# Patient Record
Sex: Male | Born: 1985 | Race: White | Hispanic: No | Marital: Married | Smoking: Never smoker
Health system: Southern US, Community
[De-identification: ages and names within clinical notes are randomized; demographics above are authoritative.]

## PROBLEM LIST (undated history)

## (undated) DIAGNOSIS — K219 Gastro-esophageal reflux disease without esophagitis: Secondary | ICD-10-CM

## (undated) DIAGNOSIS — E079 Disorder of thyroid, unspecified: Secondary | ICD-10-CM

## (undated) DIAGNOSIS — K200 Eosinophilic esophagitis: Secondary | ICD-10-CM

## (undated) HISTORY — PX: FOOT SURGERY: SHX648

## (undated) HISTORY — PX: TONSILLECTOMY: SUR1361

---

## 2009-02-02 ENCOUNTER — Encounter: Admission: RE | Admit: 2009-02-02 | Discharge: 2009-02-02 | Payer: Self-pay | Admitting: Internal Medicine

## 2010-01-21 ENCOUNTER — Observation Stay (HOSPITAL_COMMUNITY): Admission: EM | Admit: 2010-01-21 | Discharge: 2010-01-22 | Payer: Self-pay | Admitting: Emergency Medicine

## 2010-09-09 LAB — CBC
Hemoglobin: 14.7 g/dL (ref 13.0–17.0)
MCH: 32.9 pg (ref 26.0–34.0)
MCHC: 34.2 g/dL (ref 30.0–36.0)
MCV: 96.3 fL (ref 78.0–100.0)
Platelets: 325 10*3/uL (ref 150–400)
RBC: 4.46 MIL/uL (ref 4.22–5.81)
WBC: 14.4 10*3/uL — ABNORMAL HIGH (ref 4.0–10.5)

## 2010-09-09 LAB — DIFFERENTIAL
Basophils Absolute: 0 10*3/uL (ref 0.0–0.1)
Basophils Relative: 0 % (ref 0–1)
Eosinophils Relative: 1 % (ref 0–5)
Lymphocytes Relative: 7 % — ABNORMAL LOW (ref 12–46)
Monocytes Relative: 3 % (ref 3–12)
Neutro Abs: 12.9 10*3/uL — ABNORMAL HIGH (ref 1.7–7.7)

## 2010-09-09 LAB — BASIC METABOLIC PANEL
BUN: 7 mg/dL (ref 6–23)
Calcium: 9.1 mg/dL (ref 8.4–10.5)
Chloride: 104 mEq/L (ref 96–112)
GFR calc non Af Amer: >60 mL/min (ref >60)
Glucose, Bld: 114 mg/dL — ABNORMAL HIGH (ref 70–99)
Sodium: 137 mEq/L (ref 135–145)

## 2010-12-09 ENCOUNTER — Ambulatory Visit: Payer: Self-pay | Admitting: Family Medicine

## 2012-09-25 ENCOUNTER — Emergency Department: Payer: Self-pay | Admitting: Emergency Medicine

## 2012-09-25 LAB — CBC
HCT: 43.6 % (ref 40.0–52.0)
HGB: 14.9 g/dL (ref 13.0–18.0)
Platelet: 325 10*3/uL (ref 150–440)
RBC: 4.64 10*6/uL (ref 4.40–5.90)
RDW: 12.2 % (ref 11.5–14.5)

## 2012-09-25 LAB — COMPREHENSIVE METABOLIC PANEL
Alkaline Phosphatase: 62 U/L (ref 50–136)
Bilirubin,Total: 0.5 mg/dL (ref 0.2–1.0)
Calcium, Total: 9 mg/dL (ref 8.5–10.1)
Co2: 27 mmol/L (ref 21–32)
EGFR (African American): >60
SGPT (ALT): 17 U/L (ref 12–78)

## 2012-09-25 LAB — TSH: Thyroid Stimulating Horm: 5.53 u[IU]/mL — ABNORMAL HIGH

## 2013-11-01 ENCOUNTER — Ambulatory Visit: Payer: Self-pay | Admitting: Physician Assistant

## 2013-11-01 LAB — RAPID STREP-A WITH REFLX: MICRO TEXT REPORT: POSITIVE

## 2014-08-06 ENCOUNTER — Ambulatory Visit: Payer: Self-pay | Admitting: Registered Nurse

## 2014-09-14 IMAGING — CT CT HEAD WITHOUT CONTRAST
2 series · 16 of 30 positions shown, 20 images · non-contrast
Comparison: none

REASON FOR EXAM: pressure and burning in head
COMMENTS:

[Series 2: without · axial · non-contrast · 0.44mm/px · z∈[-310,-186]mm · 13 of 31 slices shown, 17 images]
[im 3/31  brain]
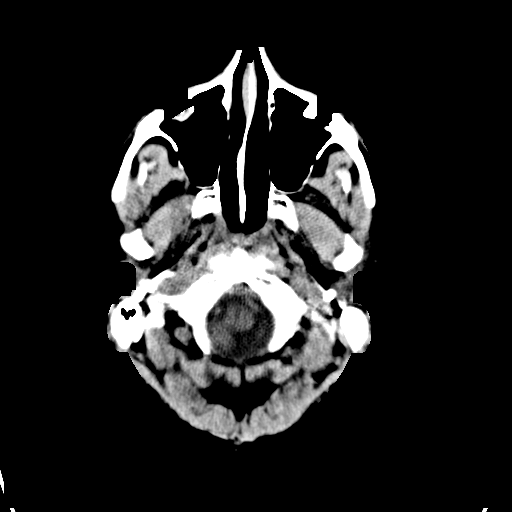
[im 3/31  bone]
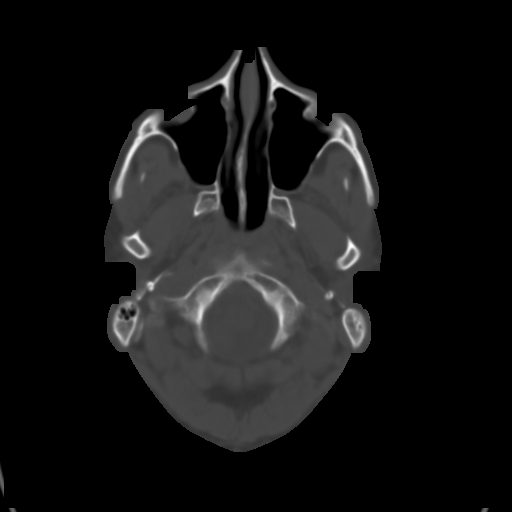
[im 5/31  brain]
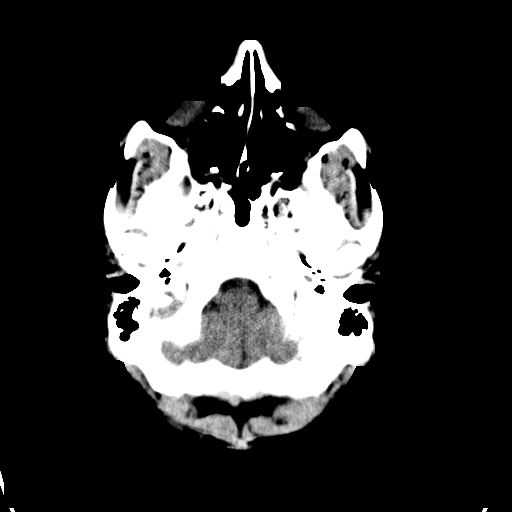
[im 7/31  brain]
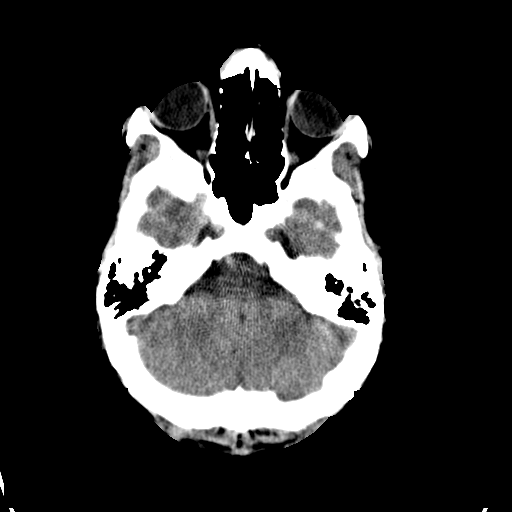
[im 9/31  brain]
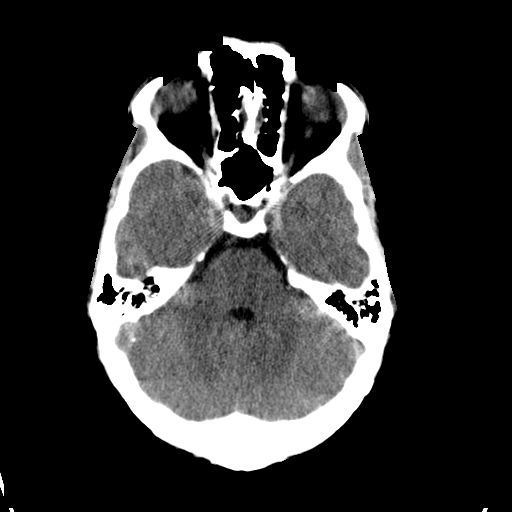
[im 11/31  brain]
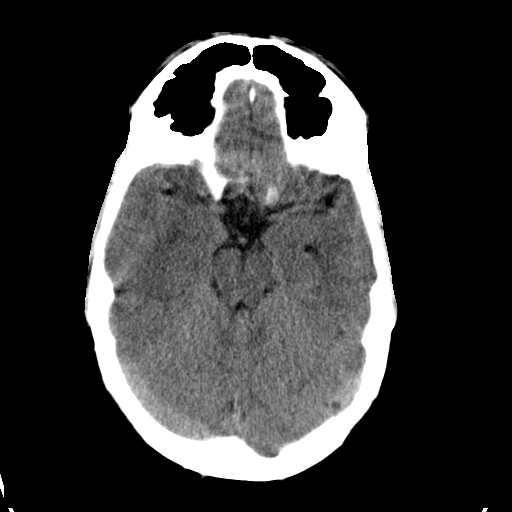
[im 11/31  bone]
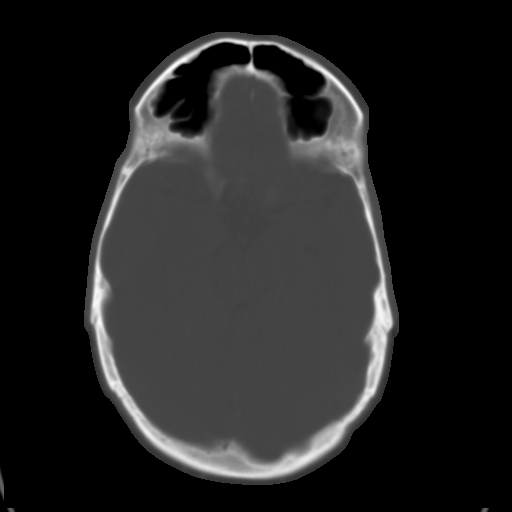
[im 13/31  brain]
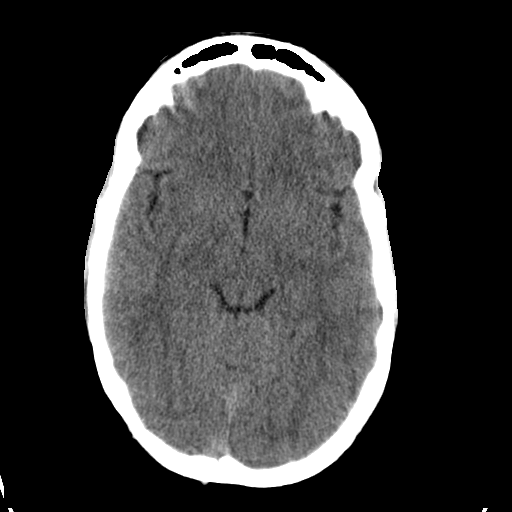
[im 16/31  brain]
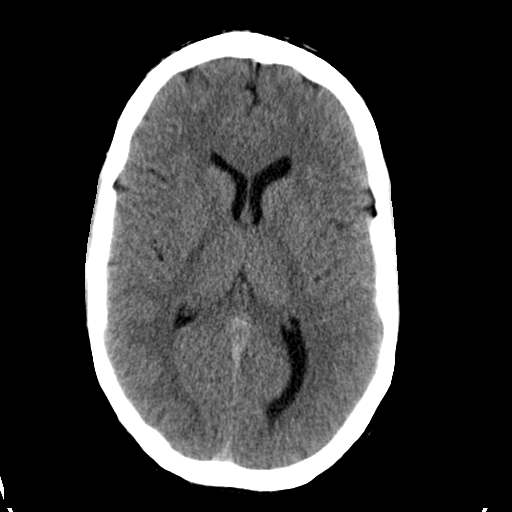
[im 18/31  brain]
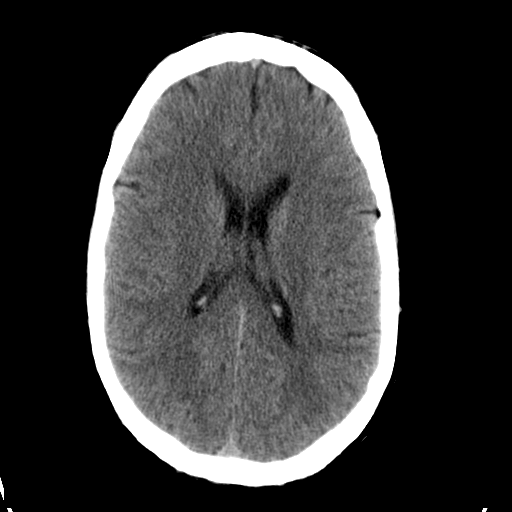
[im 20/31  brain]
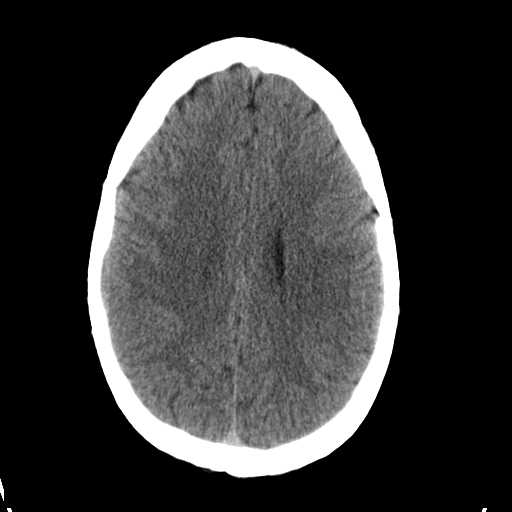
[im 20/31  bone]
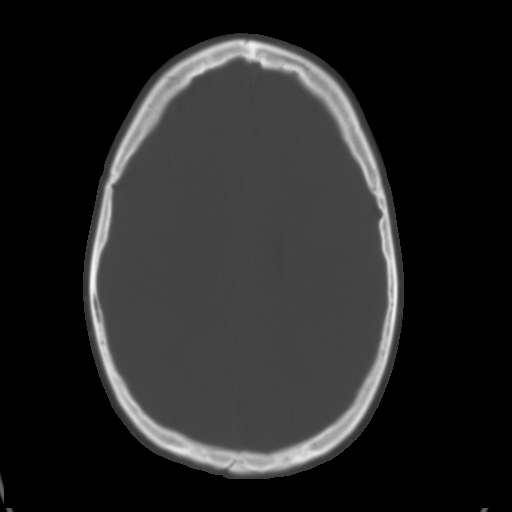
[im 22/31  brain]
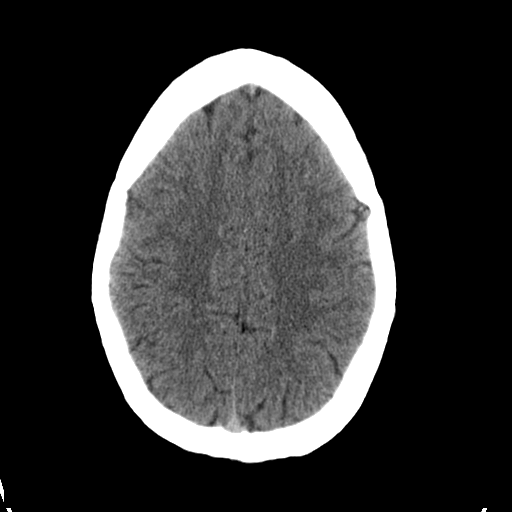
[im 24/31  brain]
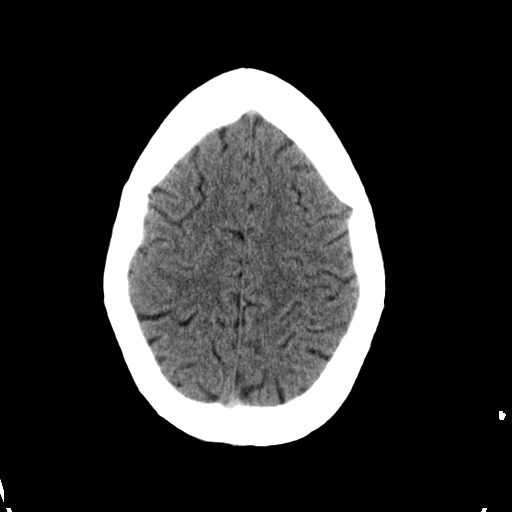
[im 26/31  brain]
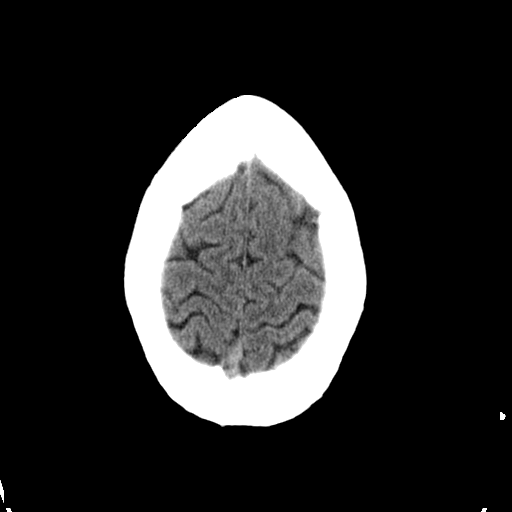
[im 28/31  brain]
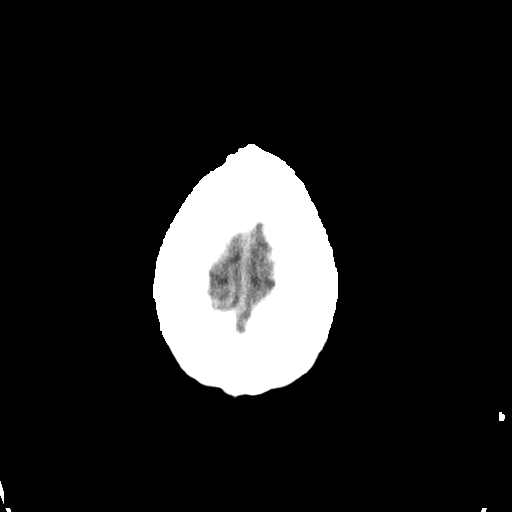
[im 28/31  bone]
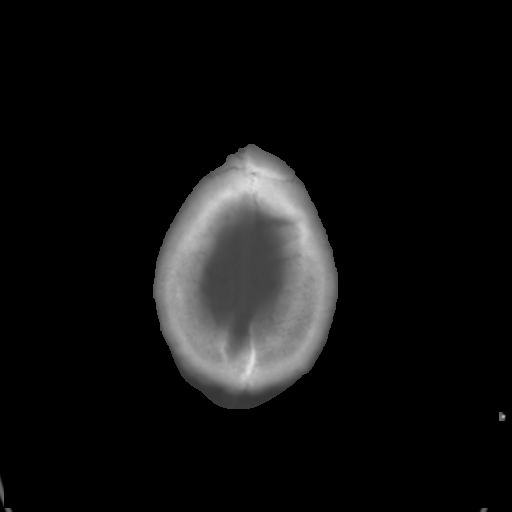

[Series 3: bone · axial · 0.44mm/px · z∈[-310,-270]mm · 3 of 31 slices shown]
[im 3/31  bone]
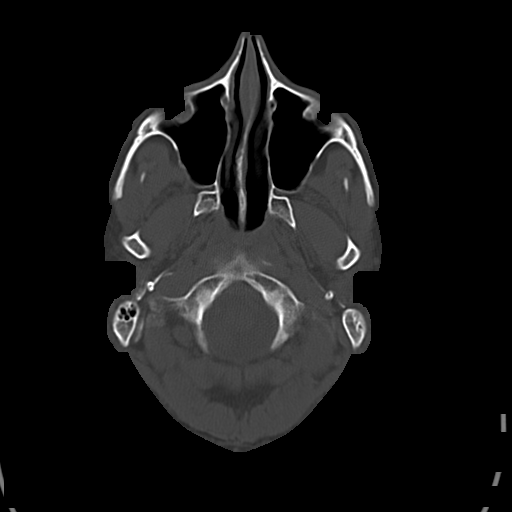
[im 7/31  bone]
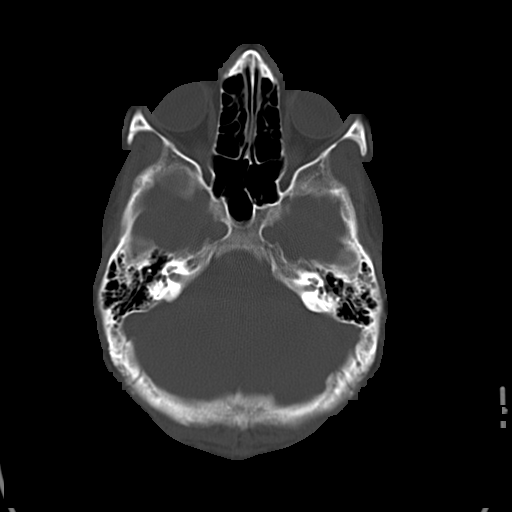
[im 11/31  bone]
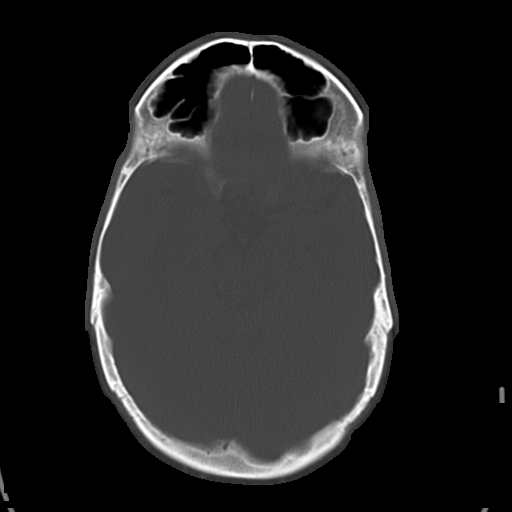

[16 of 30 positions shown; findings below may reference images not displayed]

PROCEDURE:     CT  - CT HEAD WITHOUT CONTRAST  - September 25, 2012 [DATE]

RESULT:     Axial noncontrast CT scanning was performed through the brain
with reconstructions at 5 mm intervals and slice thicknesses.

The ventricles are normal in size and position. There is no intracranial
hemorrhage nor intracranial mass effect. The cerebellum and brainstem are
normal in density. There are no abnormal intracranial calcifications.

At bone window settings there are no air fluid levels in the paranasal
sinuses. There is no evidence of an acute skull fracture.
IMPRESSION: There is no acute intracranial abnormality.

A preliminary report was sent to the [HOSPITAL] the conclusion
of the study.

[REDACTED]

## 2017-08-17 ENCOUNTER — Other Ambulatory Visit: Payer: Self-pay

## 2017-08-17 ENCOUNTER — Encounter: Payer: Self-pay | Admitting: Gynecology

## 2017-08-17 ENCOUNTER — Ambulatory Visit
Admission: EM | Admit: 2017-08-17 | Discharge: 2017-08-17 | Disposition: A | Discharge status: Home / Self Care | Payer: Managed Care, Other (non HMO)

## 2017-08-17 DIAGNOSIS — R21 Rash and other nonspecific skin eruption: Secondary | ICD-10-CM | POA: Diagnosis not present

## 2017-08-17 DIAGNOSIS — L249 Irritant contact dermatitis, unspecified cause: Secondary | ICD-10-CM

## 2017-08-17 HISTORY — DX: Disorder of thyroid, unspecified: E07.9

## 2017-08-17 HISTORY — DX: Gastro-esophageal reflux disease without esophagitis: K21.9

## 2017-08-17 HISTORY — DX: Eosinophilic esophagitis: K20.0

## 2017-08-17 MED ORDER — METHYLPREDNISOLONE 4 MG PO TBPK: ORAL_TABLET | ORAL | 0 refills | Status: AC

## 2017-08-17 NOTE — ED Provider Notes (Signed)
[facility]    CSN: [id number] Arrival date & time: 08/17/17  1520     History   Chief Complaint Chief Complaint  Patient presents with  . Rash    HPI Joel Miles is a 32 y.o. male.   Presents to the urgent care facility for evaluation of facial rash swelling and itching.  Symptoms have been present since Monday.  Patient states he was seen by dermatologist recently and started on ivermectin Monday.  He has a topical ivermectin and oral ivermectin prescription.  Patient states since starting the medication he is developed some mild facial swelling dry skin and a lot of itching.  He denies any difficulty swallowing, shortness of breath.  He has been taking Benadryl with only mild improvement.  Denies any fevers.  HPI  Past Medical History:  Diagnosis Date  . Eosinophilic esophagitis   . GERD (gastroesophageal reflux disease)   . Thyroid disease     There are no active problems to display for this patient.   Past Surgical History:  Procedure Laterality Date  . FOOT SURGERY Right   . TONSILLECTOMY         Home Medications    Prior to Admission medications   Medication Sig Start Date End Date Taking? Authorizing Provider  cetirizine (ZYRTEC) 10 MG tablet Take 10 mg by mouth daily.   Yes [provider]  Emollient (NEUTROGENA HEALTHY SKIN FACE) LOTN Apply topically.   Yes [provider]  ivermectin (STROMECTOL) 3 MG TABS tablet Take 3 mg by mouth once.   Yes [provider]  Ivermectin 1 % CREA Apply topically.   Yes [provider]  levothyroxine (SYNTHROID, LEVOTHROID) 112 MCG tablet Take 112 mcg by mouth daily before breakfast.   Yes [provider]  omeprazole (PRILOSEC) 20 MG capsule Take 30 mg by mouth daily.    Yes [provider]  methylPREDNISolone (MEDROL DOSEPAK) 4 MG TBPK tablet Take as directed 08/17/17   Evon Slack, PA-C    Family History Family History  Problem Relation Age of  Onset  . Thyroid disease Mother   . Thyroid disease Sister     Social History Social History   Tobacco Use  . Smoking status: Never Smoker  . Smokeless tobacco: Never Used  Substance Use Topics  . Alcohol use: Yes  . Drug use: No     Allergies   Venomil honey bee venom [honey bee venom]   Review of Systems Review of Systems  HENT: Negative for trouble swallowing.   Eyes: Negative for visual disturbance.  Respiratory: Negative for cough and shortness of breath.   Cardiovascular: Negative for chest pain.  Skin: Positive for rash.     Physical Exam Triage Vital Signs ED Triage Vitals  Enc Vitals Group     BP 08/17/17 1543 124/84     Pulse Rate 08/17/17 1543 (!) 111     Resp 08/17/17 1543 16     Temp 08/17/17 1543 98.3 F (36.8 C)     Temp Source 08/17/17 1543 Oral     SpO2 08/17/17 1543 97 %     Weight 08/17/17 1542 165 lb (74.8 kg)     Height 08/17/17 1542 5\' 9"  (1.753 m)     Head Circumference --      Peak Flow --      Pain Score 08/17/17 1541 7     Pain Loc --      Pain Edu? --  Excl. in GC? --    No data found.  Updated Vital Signs BP 124/84 (BP Location: Left Arm)   Pulse (!) 111   Temp 98.3 F (36.8 C) (Oral)   Resp 16   Ht 5\' 9"  (1.753 m)   Wt 165 lb (74.8 kg)   SpO2 97%   BMI 24.37 kg/m   Visual Acuity Right Eye Distance:   Left Eye Distance:   Bilateral Distance:    Right Eye Near:   Left Eye Near:    Bilateral Near:     Physical Exam  Constitutional: He appears well-developed and well-nourished.  HENT:  Head: Normocephalic and atraumatic.  No signs of facial swelling or edema.  Eyes: Conjunctivae are normal.  Neck: Normal range of motion.  Cardiovascular: Normal rate.  Pulmonary/Chest: Effort normal. No respiratory distress.  Musculoskeletal: Normal range of motion.  Skin: Skin is warm. There is erythema.  Patient with diffuse nonedematous erythematous macular rash on the face with dry scaly skin.  Patient states skin is  flaky because he has been scratching significantly.  There is no fluctuance, drainage or bullous lesions.  There is no signs of skin abscesses.  Rash is not warm.  No signs of angioedema.   Psychiatric: He has a normal mood and affect. His behavior is normal.     UC Treatments / Results  Labs (all labs ordered are listed, but only abnormal results are displayed) Labs Reviewed - No data to display  EKG  EKG Interpretation None       Radiology No results found.  Procedures Procedures (including critical care time)  Medications Ordered in UC Medications - No data to display   Initial Impression / Assessment and Plan / UC Course  I have reviewed the triage vital signs and the nursing notes.  Pertinent labs & imaging results that were available during my care of the patient were reviewed by me and considered in my medical decision making (see chart for details).     32 year old male with facial rash after starting ivermectin treatment for rosacea.  Side effect profile includes facial edema with pruritic rash and urticaria.  Will have patient hold her ivermectin treatment, call dermatologist first thing Monday morning.  He will continue with Benadryl and also take prednisone as prescribed.  He is educated on signs and symptoms return to clinic for.  Final Clinical Impressions(s) / UC Diagnoses   Final diagnoses:  Rash  Irritant contact dermatitis, unspecified trigger    ED Discharge Orders        Ordered    methylPREDNISolone (MEDROL DOSEPAK) 4 MG TBPK tablet     08/17/17 1604         Evon SlackGaines, Anusha Claus C, [place]PA-C 08/17/17 [phone]

## 2017-08-17 NOTE — Discharge Instructions (Signed)
Please avoid topical and oral ivermectin until follow-up with dermatologist.  Continue with Benadryl and take prednisone as prescribed.

## 2017-08-17 NOTE — ED Triage Notes (Signed)
Per patient rash of face x 1 week. Pt saw dermatologist on Monday x 5 rays . Patient was given RX. Ivermectin 3 mg. Per patient rash not getting better.
